# Patient Record
Sex: Male | Born: 1955 | Race: White | Hispanic: No | Marital: Married
Health system: Southern US, Community
[De-identification: ages and names within clinical notes are randomized; demographics above are authoritative.]

---

## 2015-02-07 ENCOUNTER — Ambulatory Visit: Payer: Self-pay | Admitting: Gastroenterology

## 2015-09-25 ENCOUNTER — Other Ambulatory Visit: Payer: Self-pay | Admitting: Gastroenterology

## 2015-09-25 DIAGNOSIS — B182 Chronic viral hepatitis C: Secondary | ICD-10-CM

## 2015-09-25 DIAGNOSIS — K7460 Unspecified cirrhosis of liver: Secondary | ICD-10-CM

## 2015-10-04 ENCOUNTER — Ambulatory Visit: Payer: Self-pay

## 2015-10-05 ENCOUNTER — Ambulatory Visit
Admission: RE | Admit: 2015-10-05 | Discharge: 2015-10-05 | Disposition: A | Discharge status: Home / Self Care | Payer: BLUE CROSS/BLUE SHIELD | Source: Ambulatory Visit | Attending: Gastroenterology | Admitting: Gastroenterology

## 2015-10-05 DIAGNOSIS — K7460 Unspecified cirrhosis of liver: Secondary | ICD-10-CM

## 2015-10-05 DIAGNOSIS — B182 Chronic viral hepatitis C: Secondary | ICD-10-CM

## 2015-10-05 MED ORDER — GADOXETATE DISODIUM 0.25 MMOL/ML IV SOLN: 10.0000 mL | Freq: Once | INTRAVENOUS | Status: DC | PRN

## 2015-10-13 ENCOUNTER — Ambulatory Visit
Admission: RE | Admit: 2015-10-13 | Discharge: 2015-10-13 | Disposition: A | Discharge status: Home / Self Care | Payer: BLUE CROSS/BLUE SHIELD | Source: Ambulatory Visit | Attending: Gastroenterology | Admitting: Gastroenterology

## 2015-10-13 DIAGNOSIS — K7460 Unspecified cirrhosis of liver: Secondary | ICD-10-CM | POA: Diagnosis not present

## 2015-10-13 DIAGNOSIS — K766 Portal hypertension: Secondary | ICD-10-CM | POA: Insufficient documentation

## 2015-10-13 DIAGNOSIS — R188 Other ascites: Secondary | ICD-10-CM | POA: Diagnosis not present

## 2015-10-13 DIAGNOSIS — R161 Splenomegaly, not elsewhere classified: Secondary | ICD-10-CM | POA: Diagnosis not present

## 2015-10-13 DIAGNOSIS — B1920 Unspecified viral hepatitis C without hepatic coma: Secondary | ICD-10-CM | POA: Insufficient documentation

## 2015-10-13 MED ORDER — GADOBENATE DIMEGLUMINE 529 MG/ML IV SOLN
20.0000 mL | Freq: Once | INTRAVENOUS | Status: AC | PRN
  Administered 2015-10-13: 20 mL via INTRAVENOUS

## 2015-10-13 NOTE — OR Nursing (Signed)
22 gauge ns lock placed right hand, pre MRI, to be removed by technician post injection. Pt tolerated well.

## 2016-02-07 ENCOUNTER — Other Ambulatory Visit: Payer: Self-pay | Admitting: Gastroenterology

## 2016-02-07 DIAGNOSIS — B182 Chronic viral hepatitis C: Secondary | ICD-10-CM

## 2016-03-25 ENCOUNTER — Ambulatory Visit
Admission: RE | Admit: 2016-03-25 | Discharge: 2016-03-25 | Disposition: A | Discharge status: Home / Self Care | Payer: BLUE CROSS/BLUE SHIELD | Source: Ambulatory Visit | Attending: Gastroenterology | Admitting: Gastroenterology

## 2016-03-25 DIAGNOSIS — B182 Chronic viral hepatitis C: Secondary | ICD-10-CM

## 2016-03-26 ENCOUNTER — Ambulatory Visit
Admission: RE | Admit: 2016-03-26 | Discharge: 2016-03-26 | Disposition: A | Discharge status: Home / Self Care | Payer: BLUE CROSS/BLUE SHIELD | Source: Ambulatory Visit | Attending: Gastroenterology | Admitting: Gastroenterology

## 2016-03-26 DIAGNOSIS — R161 Splenomegaly, not elsewhere classified: Secondary | ICD-10-CM | POA: Insufficient documentation

## 2016-03-26 DIAGNOSIS — K8020 Calculus of gallbladder without cholecystitis without obstruction: Secondary | ICD-10-CM | POA: Insufficient documentation

## 2016-03-26 DIAGNOSIS — B182 Chronic viral hepatitis C: Secondary | ICD-10-CM | POA: Insufficient documentation

## 2016-03-26 DIAGNOSIS — K7030 Alcoholic cirrhosis of liver without ascites: Secondary | ICD-10-CM | POA: Insufficient documentation

## 2017-04-02 IMAGING — US US ABDOMEN COMPLETE
1 series · 13 of 25 positions shown · non-contrast
Comparison: MRI of the abdomen of October 13, 2015 and ultrasound
of the abdomen of February 07, 2015

CLINICAL DATA: Chronic hepatitis-C

EXAM:
ABDOMEN ULTRASOUND COMPLETE

[Series 2: us abdomen complete · 0.22mm/px · 13 of 83 slices shown]
[im 1/83]
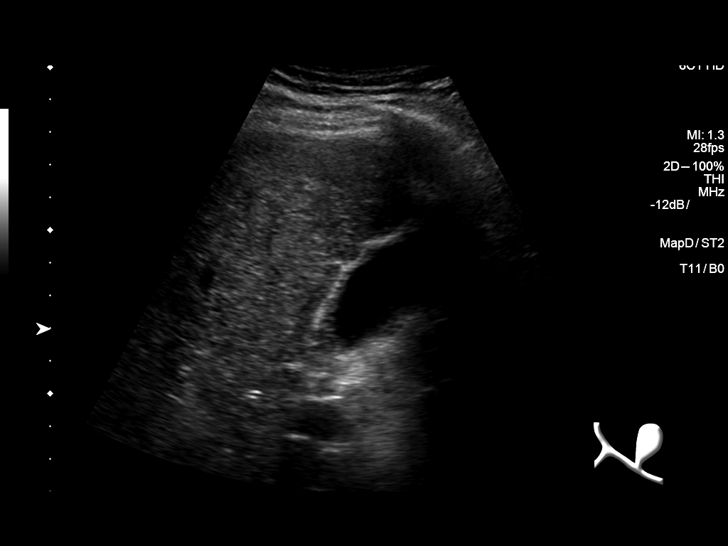
[im 7/83]
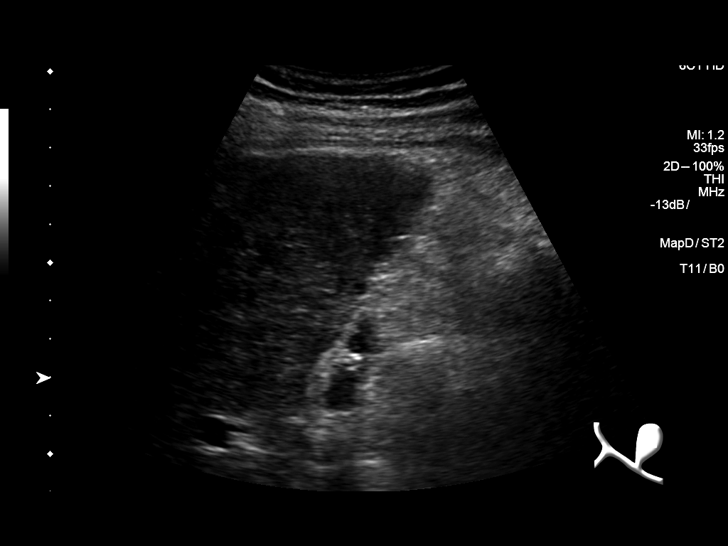
[im 14/83]
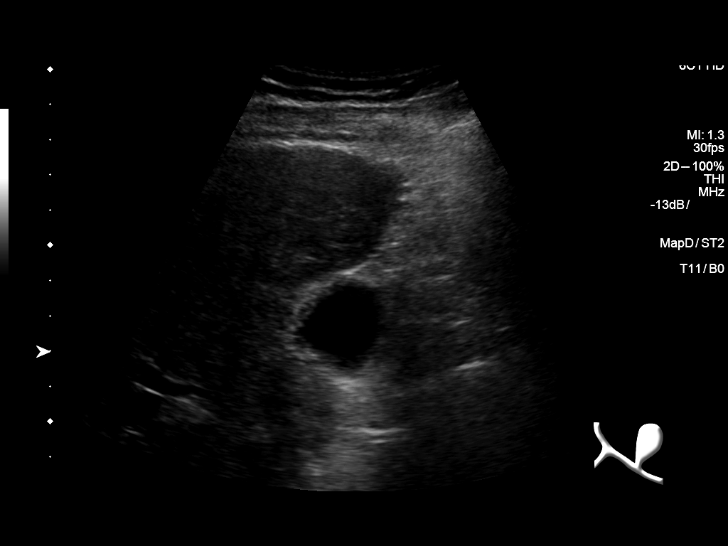
[im 21/83]
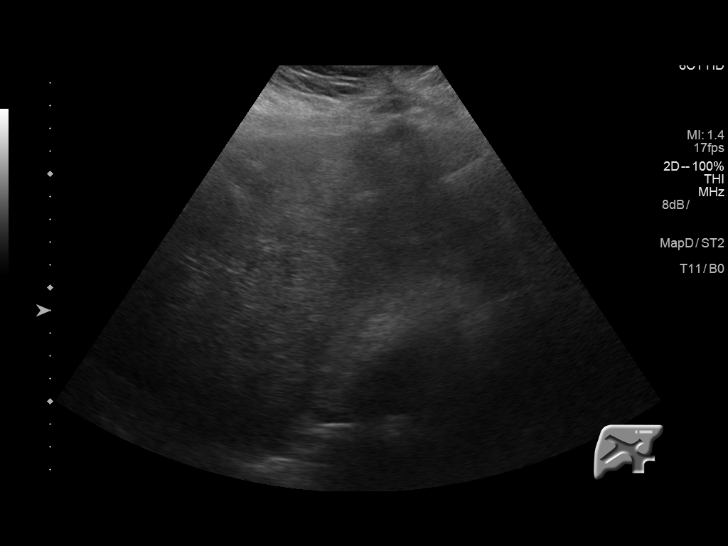
[im 28/83]
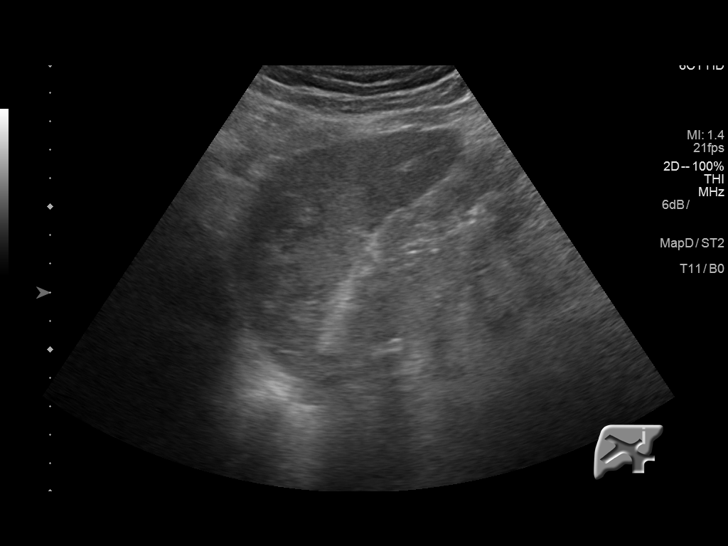
[im 35/83]
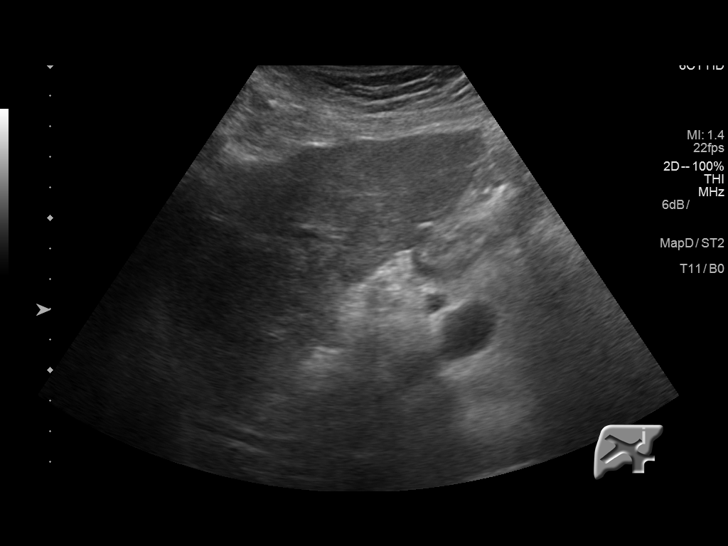
[im 42/83]
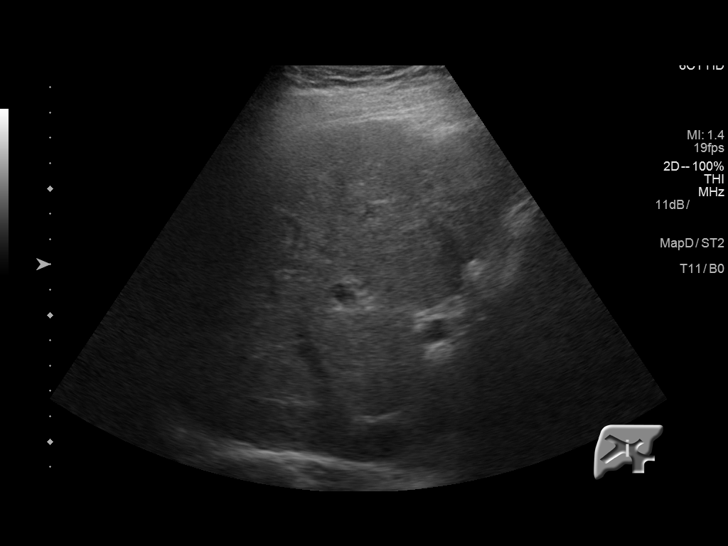
[im 48/83]
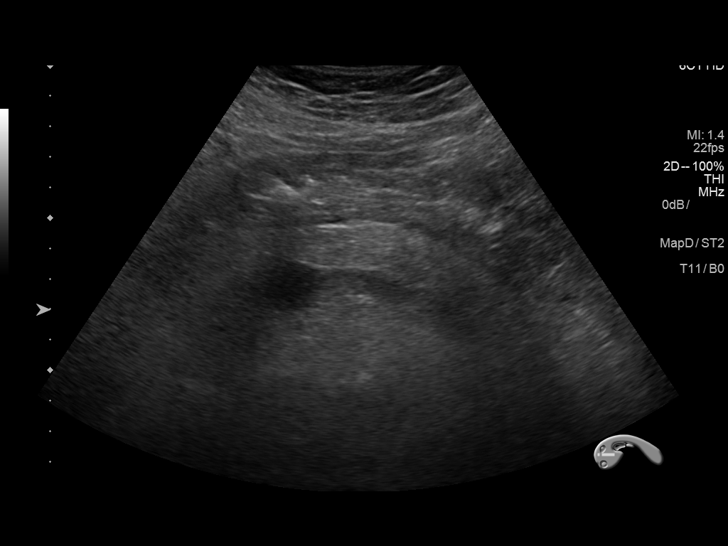
[im 55/83]
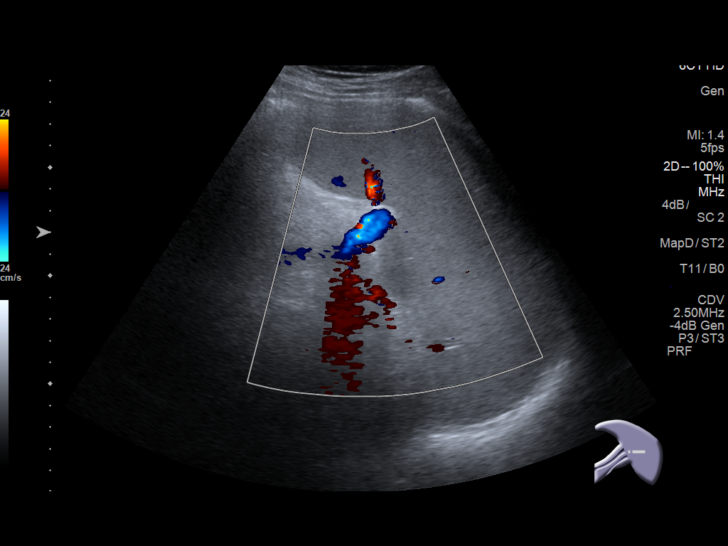
[im 62/83]
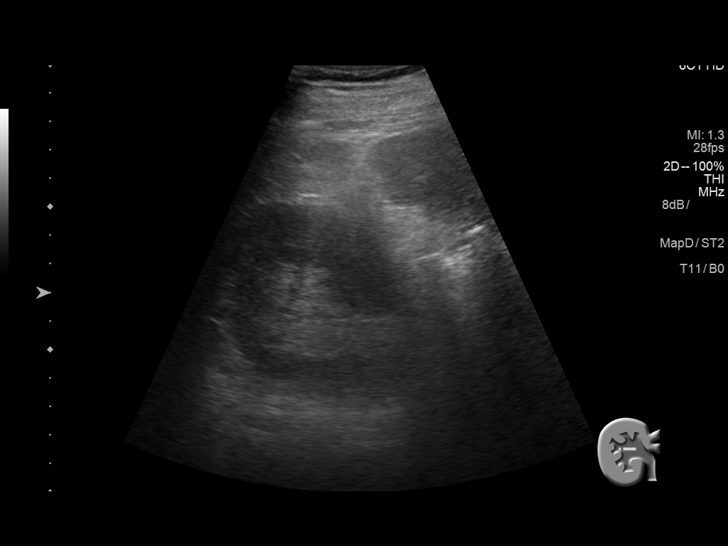
[im 69/83]
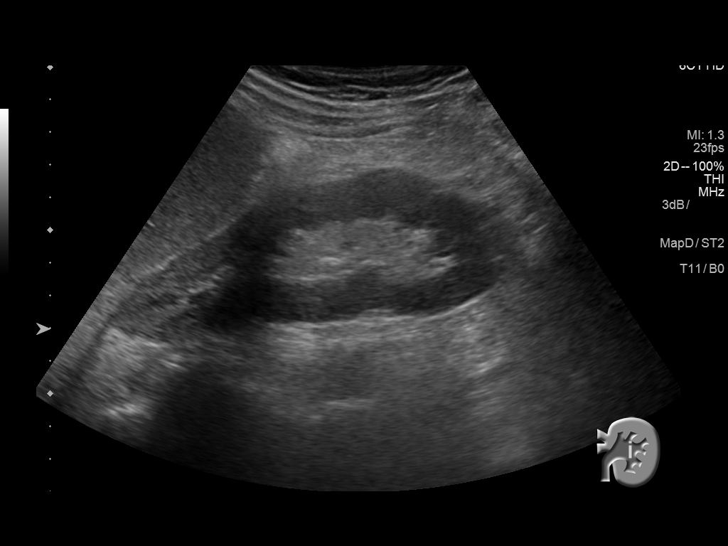
[im 76/83]
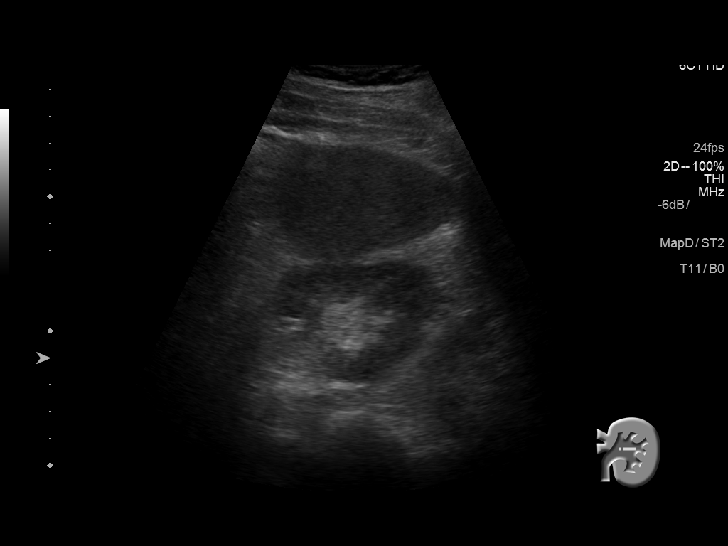
[im 83/83]
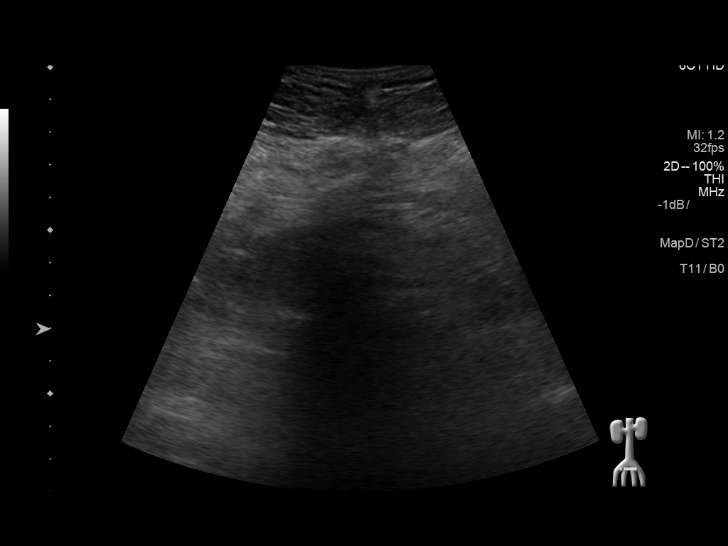

[13 of 25 positions shown; findings below may reference images not displayed]

FINDINGS: The study is somewhat limited due to bowel gas.

Gallbladder: The gallbladder is adequately distended. There are
internal echoes consistent with stones. These are not clearly
mobile. There is mild gallbladder wall thickening to 6.6 mm focally.
Elsewhere the wall thickness does not exceed 3 mm. There is no
positive sonographic Murphy's sign reported.

Common bile duct: Diameter: 4.2 mm

Liver: The hepatic echotexture is heterogeneous. The surface contour
is nodular. There is no discrete mass or ductal dilation.

IVC: No abnormality visualized.

Pancreas: There is limited visualization of the pancreatic head and
distal tail. The pancreatic body appears normal.

Spleen: There is splenomegaly with dimensions of 13.1 x 17 x 7.5 cm.
The calculated volume is 873 cc.

Right Kidney: Length: 12.6 cm. Echogenicity within normal limits. No
mass or hydronephrosis visualized.

Left Kidney: Length: 12.2 cm. Echogenicity within normal limits. No
mass or hydronephrosis visualized.

Abdominal aorta: Bowel gas limits evaluation of the abdominal aorta.
No aneurysm is observed.

Other findings: No ascites is demonstrated.
IMPRESSION: 1. Cirrhotic changes within the liver. No focal mass is observed.
There is splenomegaly. There is no ascites.
2. Gallstones without definite evidence of acute cholecystitis.
3. The kidneys are unremarkable. Bowel gas limits evaluation of the
pancreas and abdominal aorta.

## 2021-02-27 DEATH — deceased
